# Patient Record
Sex: Female | Born: 1940 | Race: White | Hispanic: No
Health system: Southern US, Community
[De-identification: ages and names within clinical notes are randomized; demographics above are authoritative.]

---

## 2005-11-24 ENCOUNTER — Ambulatory Visit: Payer: Self-pay | Admitting: Unknown Physician Specialty

## 2006-06-19 ENCOUNTER — Ambulatory Visit: Payer: Self-pay | Admitting: Gastroenterology

## 2008-02-17 ENCOUNTER — Emergency Department: Payer: Self-pay | Admitting: Emergency Medicine

## 2008-06-15 ENCOUNTER — Emergency Department: Payer: Self-pay | Admitting: Emergency Medicine

## 2008-07-20 ENCOUNTER — Inpatient Hospital Stay: Payer: Self-pay | Admitting: Internal Medicine

## 2008-08-21 ENCOUNTER — Ambulatory Visit: Payer: Self-pay | Admitting: Unknown Physician Specialty

## 2009-05-26 ENCOUNTER — Ambulatory Visit: Payer: Self-pay | Admitting: Ophthalmology

## 2009-08-04 ENCOUNTER — Ambulatory Visit: Payer: Self-pay | Admitting: Ophthalmology

## 2009-09-10 ENCOUNTER — Ambulatory Visit: Payer: Self-pay | Admitting: Family Medicine

## 2009-10-01 ENCOUNTER — Ambulatory Visit: Payer: Self-pay | Admitting: Family Medicine

## 2009-10-05 ENCOUNTER — Ambulatory Visit: Payer: Self-pay | Admitting: Oncology

## 2009-10-06 ENCOUNTER — Ambulatory Visit: Payer: Self-pay | Admitting: Oncology

## 2009-10-13 ENCOUNTER — Ambulatory Visit: Payer: Self-pay | Admitting: Oncology

## 2009-10-19 LAB — PATHOLOGY REPORT

## 2009-11-05 ENCOUNTER — Ambulatory Visit: Payer: Self-pay | Admitting: Oncology

## 2009-12-30 ENCOUNTER — Ambulatory Visit: Payer: Self-pay | Admitting: Oncology

## 2011-11-20 IMAGING — CT NM PET TUM IMG LTD AREA
5 series · 25 of 25 positions shown · non-contrast
Comparison: none

REASON FOR EXAM: Pulmonary Nodule
COMMENTS:

PROCEDURE:     PET - PET/CT DX LUNG CA  - October 06, 2009 [DATE]
RESULT:
HISTORY: Pulmonary nodule.

[Series 3: ct wb 3.0 b30f · axial · 3.0mm · 0.98mm/px · z∈[-1412,-544]mm · 11 of 435 slices shown]
[im 1/435  soft-tissue]
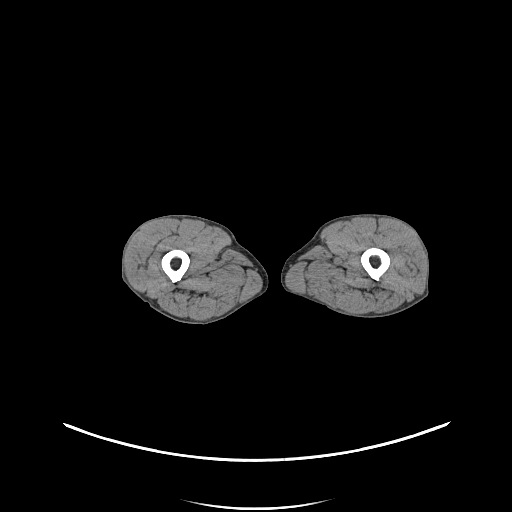
[im 44/435  soft-tissue]
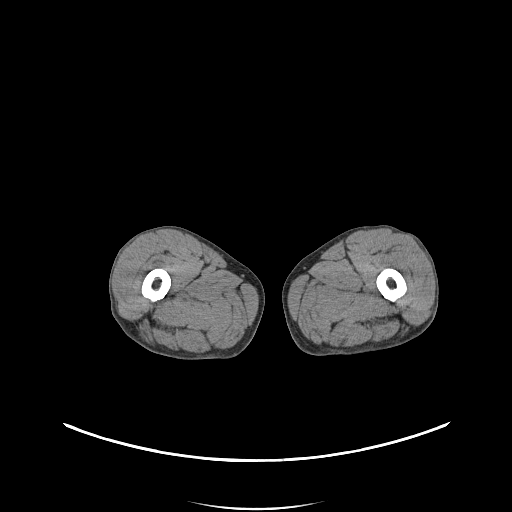
[im 87/435  soft-tissue]
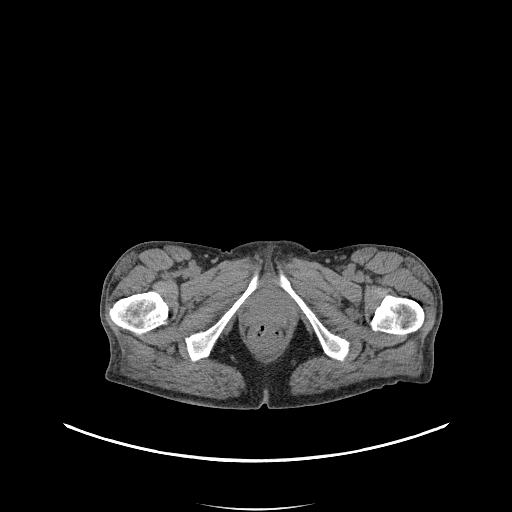
[im 131/435  soft-tissue]
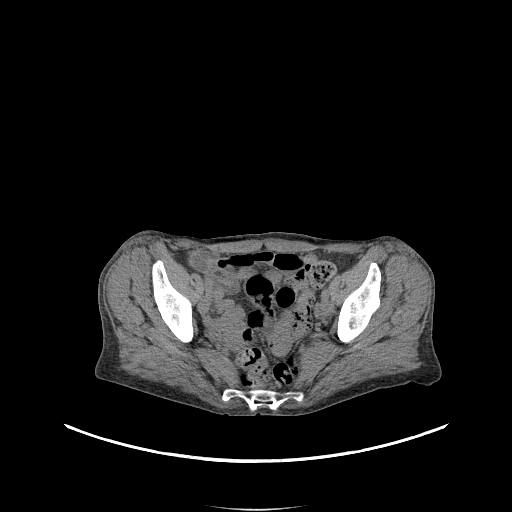
[im 174/435  soft-tissue]
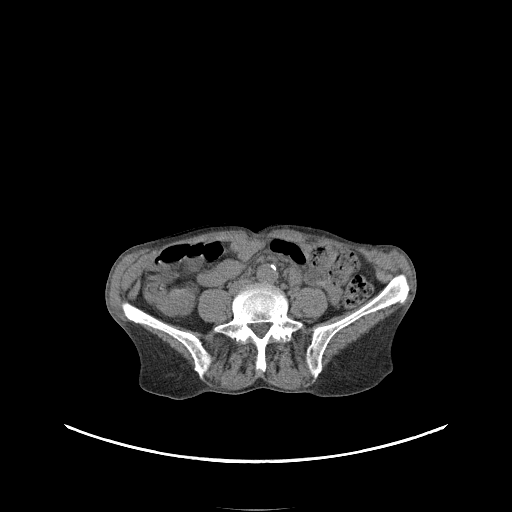
[im 218/435  soft-tissue]
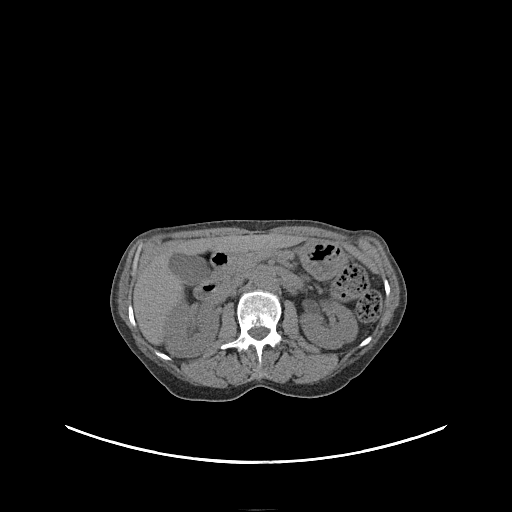
[im 261/435  soft-tissue]
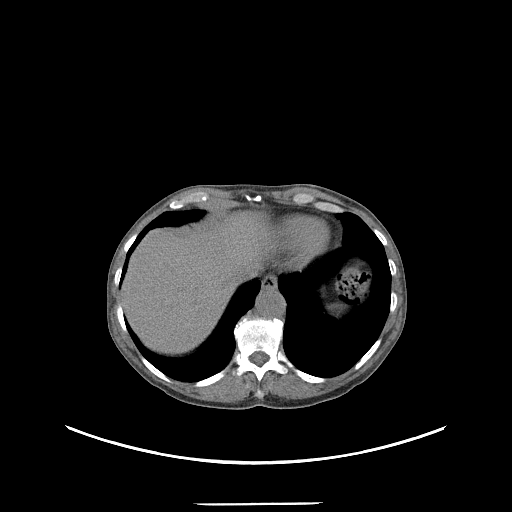
[im 304/435  soft-tissue]
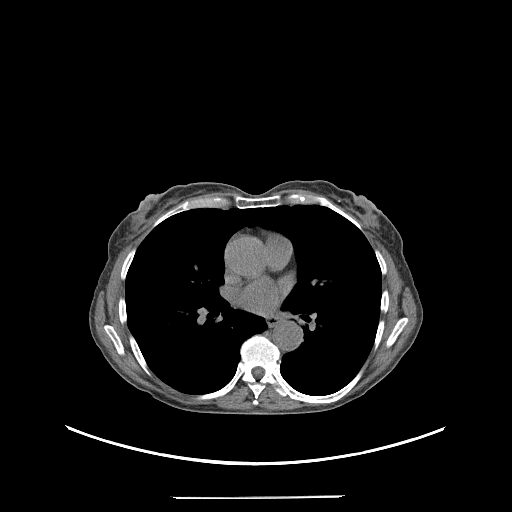
[im 348/435  soft-tissue]
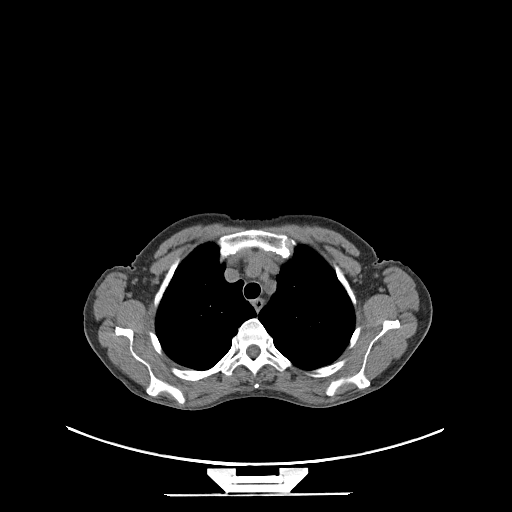
[im 391/435  soft-tissue]
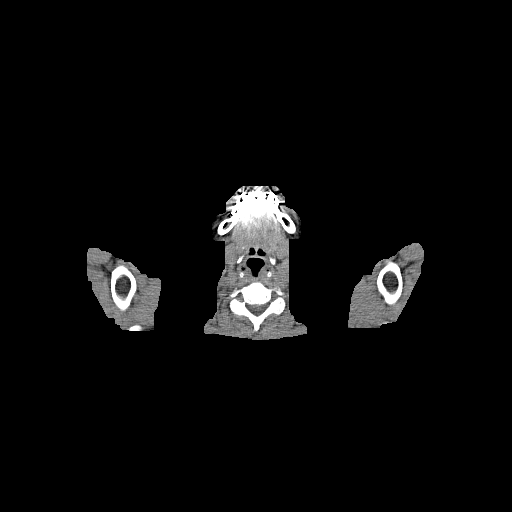
[im 435/435  soft-tissue]
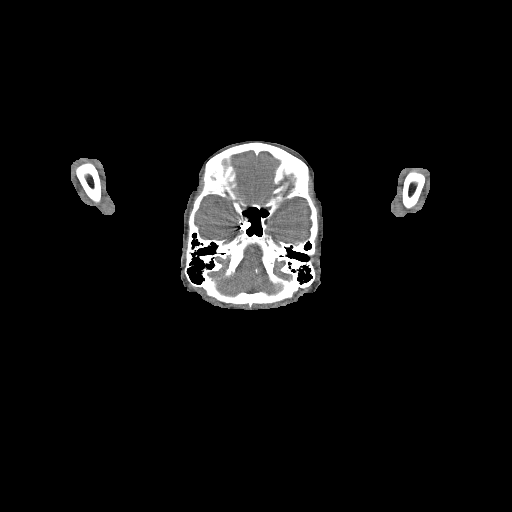

[Series 102: pet wb · axial · 5.0mm · 4.07mm/px · z∈[-1410,-544]mm · 7 of 290 slices shown]
[im 1/290]
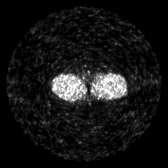
[im 49/290]
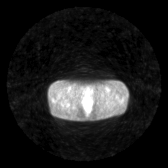
[im 97/290]
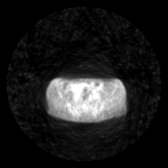
[im 145/290]
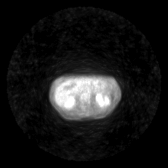
[im 193/290]
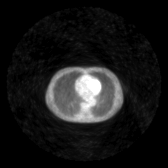
[im 241/290]
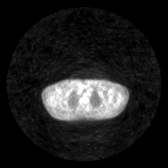
[im 290/290]
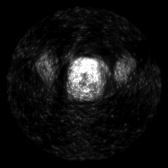

[Series 603: pet axial · 4 of 166 slices shown]
[im 1/166]
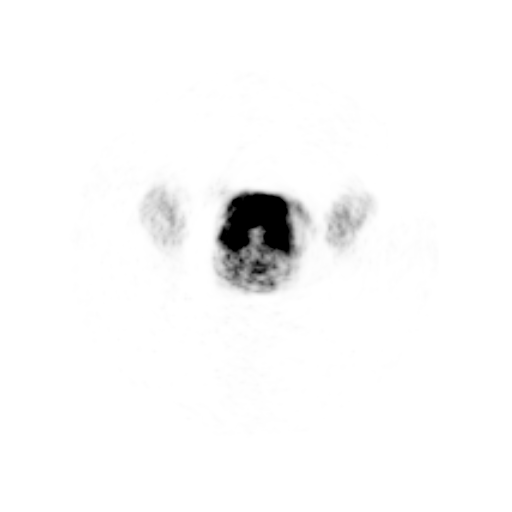
[im 56/166]
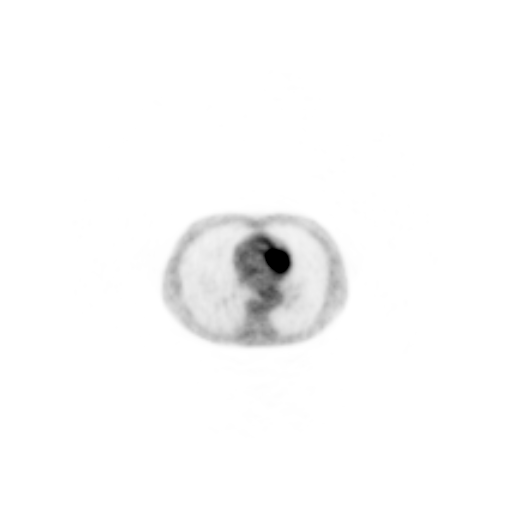
[im 111/166]
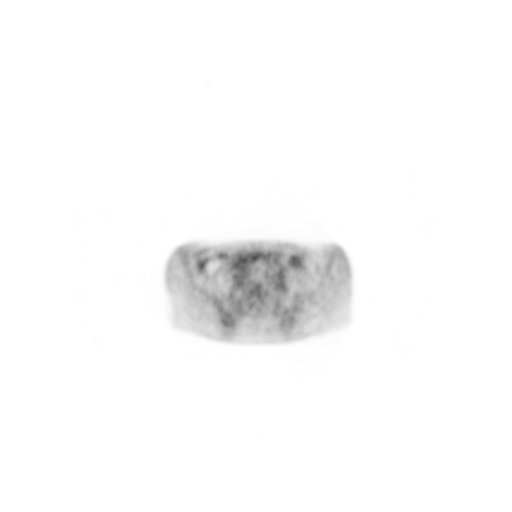
[im 166/166]
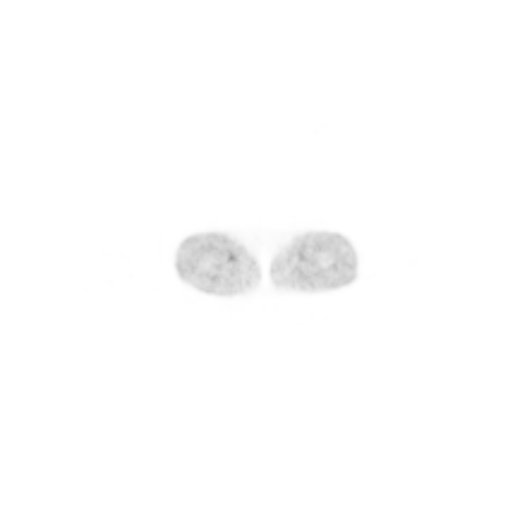

[Series 604: pet coronal · 1 of 50 slices shown]
[im 1/50]
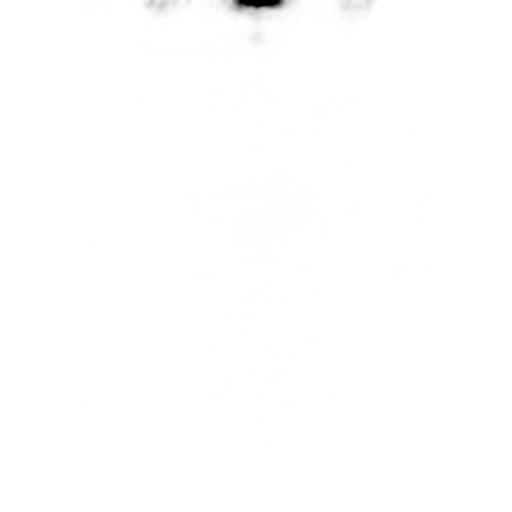

[Series 605: pet sagittal · 2 of 66 slices shown]
[im 1/66]
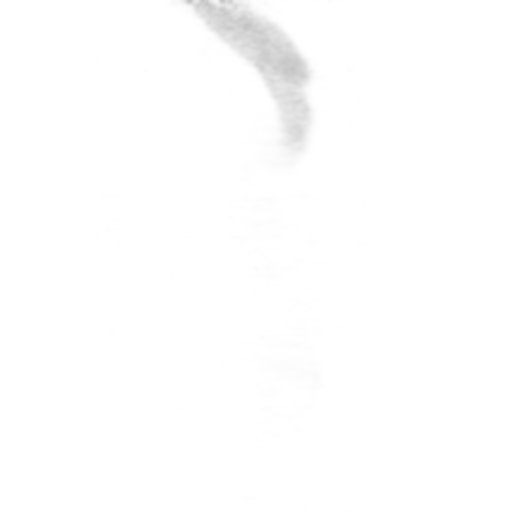
[im 66/66]
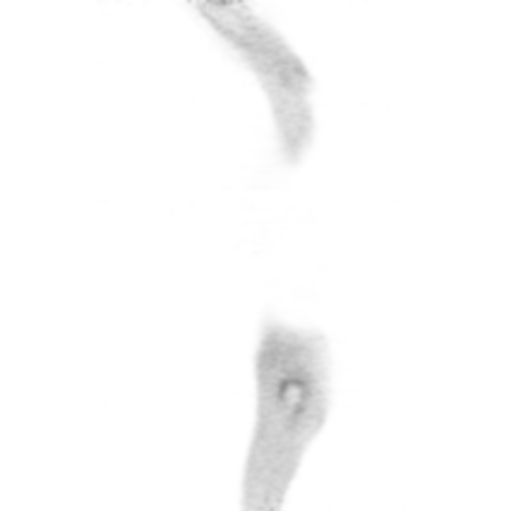

[25 of 25 positions shown; findings below may reference images not displayed]

COMPARISON STUDY: CT of the chest of 10/01/2009.

PROCEDURE AND FINDINGS: Standard PET CT is obtained. CT was performed
following determination of a fasting blood sugar of 110 mg/dl and the
administration of 12.57 mCi of F-18 FDG. CT is obtained for attenuation,
correction and fusion. Right upper lobe mass lesion is intensely PET
positive with SUV of 8. Faint uptake is noted in the left lower ill-defined
density noted on recent Chest CT. No abnormalities are noted in the
subdiaphragmatic region. The abdomen and pelvis are unremarkable. CT
obtained for attenuation, correction and fusion reveals the pulmonary
findings noted on recent Chest CT. Also noted are gallstones. Also noted is
bladder wall thickening. Urologic consultation should be considered.
IMPRESSION: 1. Intensely PET positive upper lung mass lesion. This could represent
malignancy. Left lower lobe ill-defined density is borderline PET positive
with an SUV of approximately 1.5 to 2.
2. Bladder wall thickening, this may be from lack of distention; however,
bladder malignancy cannot be excluded and urologic consultation is suggested.

## 2014-03-03 ENCOUNTER — Ambulatory Visit: Payer: Self-pay | Admitting: Internal Medicine

## 2014-04-09 ENCOUNTER — Ambulatory Visit: Payer: Self-pay | Admitting: Internal Medicine

## 2014-05-06 ENCOUNTER — Ambulatory Visit
Admit: 2014-05-06 | Disposition: A | Discharge status: Home / Self Care | Payer: Self-pay | Attending: Internal Medicine | Admitting: Internal Medicine

## 2014-06-06 ENCOUNTER — Ambulatory Visit
Admit: 2014-06-06 | Disposition: A | Discharge status: Home / Self Care | Payer: Self-pay | Attending: Internal Medicine | Admitting: Internal Medicine

## 2014-07-06 NOTE — Consult Note (Signed)
   Comments   I spoke with pt's hospice RN, Hildred AlaminSara Martin [phone]). Updated her on pt's visit yesterday. RN will follow up in use of naloxegol for OIC. RN will also check U/A to assure pt's UTI has been adequately tx'd.   Electronic Signatures: Stace Peace, Harriett SineNancy (MD)  (Signed 17-Mar-16 08:45)  Authored: Palliative Care   Last Updated: 17-Mar-16 08:45 by Thao Bauza, Harriett SineNancy (MD)

## 2015-08-06 DEATH — deceased
